# Patient Record
Sex: Male | Born: 1945 | Hispanic: Refuse to answer
Health system: Western US, Academic
[De-identification: ages and names within clinical notes are randomized; demographics above are authoritative.]

## PROBLEM LIST (undated history)

## (undated) DEATH — deceased

---

## 2014-01-14 ENCOUNTER — Ambulatory Visit: Payer: BLUE CROSS/BLUE SHIELD | Attending: Neurology | Admitting: Neurology

## 2014-01-14 ENCOUNTER — Encounter (HOSPITAL_BASED_OUTPATIENT_CLINIC_OR_DEPARTMENT_OTHER): Payer: Self-pay | Admitting: Neurology

## 2014-01-14 ENCOUNTER — Ambulatory Visit (HOSPITAL_BASED_OUTPATIENT_CLINIC_OR_DEPARTMENT_OTHER): Payer: BLUE CROSS/BLUE SHIELD | Admitting: Neurology

## 2014-01-14 DIAGNOSIS — G609 Hereditary and idiopathic neuropathy, unspecified: Secondary | ICD-10-CM | POA: Insufficient documentation

## 2014-01-14 DIAGNOSIS — G629 Polyneuropathy, unspecified: Secondary | ICD-10-CM

## 2014-01-14 LAB — 2H GLUCOSE TOL TEST, NONPREG
Glucose 2 Hr. Post Interp: <140
Glucose, BLD: 158 mg/dL

## 2014-01-14 LAB — ZINC: Zinc: 83 ug/dL (ref 60–120)

## 2014-01-14 LAB — GLUCOSE, FASTING: Glucose, Fasting: 95 mg/dL (ref 62–125)

## 2014-01-14 NOTE — Patient Instructions (Addendum)
IMPRESSION  68 year old man with glaucoma, BPH, dyslipidemia and impaired glucose intolerance with bilateral symmetric distal leg numbness with sensory level below the ankle for light touch and at the base of the toe for pinprick with reduction in vibration and mild proprioception changes on the left. Additionally there is some foot pain with walking and  reflex asymmetry in the arms. There is objective evidence of small fiber and large fiber sensory involvement, however, give the reflex changes cervical spine compression should be screened for if the patient develops arm symptoms.     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 30% of patients stabilize once glycemic control is optimized and 30% can achieve minor improvement.         PLAN:    Investigations  1. We will arrange for the patient to have nerve conduction [facility]  2. Will investigate the patient for reversible causes of neuropathy with SPEP, MMA, Copper, Zinc, VDRL, 2 hour glucose tolerance testing. Abnormal 2 hour glucose tolerance test.  3. The patient should have an MRI of his [facility] Spine [facility] by his primary care physician if he develops worsened arm symptoms.  4. For the patient's foot pain the primary care physicians should arrange for Xrays to ensure that there are no joint changes as a result of the neuropathy responsible for the patient's symptoms of pain with walking    Management  1. The primary care physician should monitor the patients impaired glucose tolerance. He may benefit from some diabetic diet education and exercise.  2. The patient should see physical therapy for balance training.  3. Alpha lipoic acid 300-600 mg per day has been shown to slow neuropathy in clinical trials this could be considered.   4. If the patient develops neuropathic pain gabapentin max daily does of 3600 mg can be  considered balancing the cognitive side effects.  .Marland Kitchen

## 2014-01-14 NOTE — Progress Notes (Signed)
[facility] CLINIC NOTE    SOURCE: The patient.    CHIEF COMPLAINT  Foot pain, loss of sensation and balance changes.      HISTORY OF PRESENTING ILLNESS  Geoffrey Merritt 68 year old man with glaucoma, BPH and dyslipidemia with billateraly numbness in the feet. The query is to evaluate for peripheral neuropathy.    His symptoms started 5 years ago approximately 2 months after achilles tendon repair with numbness in his left toes. Initially, he though this was related to his surgery, however, a few month later he developed similar sensory symptoms in his right foot.  His feet have distal tingling paresthesias. He is able to feel his feet but has altered sensation. The progression of his numbness has been slow, however, a few years after onset he noticed severe aching foot pain and hammering of his toes. His foot pain prevents him from running marathons and even doing short recreational running. The pain is centered in his big toe and into the ball of his foot he rates the pain intensity as 5/10. It is aggravated by walking more than 1/2 a block and walking barefoot on uneven surfaces, sand particularly. He has not tried nerve specific medication but has tried naprosen which was effective but discontinued because he felt he was taking it too often.     His balance has worsened particularly in the dark. He denies back or neck pain. His bowel and bladder function has been unchanged from baseline. He denies post prandial bloating or nausea. His bowel habits do not cycle between diarrhea and constipation. He denies inappropriate sweating or orthostatic lightheadedness. He does suffer from erectile dysfunction. His hearing is normal. He has recently had a mildly elevated HbAIC and glucose. There is a family history of diabetes in his sister who may also have neuropathy. There is no family history of hammer toes, high arches or inherited neuropathy.        PAST MEDICAL HISTORY  1. BPH  2. Dyslipidemia - muscle problems with initial  statin, lipitor cognitive issues  3. imapired glucose intolerance  4. Gluten free diet - improved bowel habits.  5. 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     ALLERGIES  Review of patient's allergies indicates:  No Known Allergies    MEDICATIONS     Previous Medications    ROSUVASTATIN CALCIUM (CRESTOR) 5 MG ORAL TAB    Take 5 mg by mouth daily.    TAMSULOSIN HCL (FLOMAX) 0.4 MG ORAL CAP    Take 0.4 mg by mouth daily.   3. Nicotine pills - for cognitive enhancement 20 mg nictoine /day x 5 years    SOCIAL HISTORY  Not a current smoker. He drinks 5 drinks per week. He denies the use of recreational drugs.  He is a Scientist, water qualityneuroscientist (department chair). He is married with 3 children.  FAMILY HISTORY  1.  Mother - [facility]  2. Father healthy   3. Sister - diabetic ? Possible neuropathy (dx 5 years ago - 2 years older)  4. Children healthy    REVIEW OF SYSTEMS    All other systems negative as per history of present illness and the scanned patient intake questionnaire, including the following.    NEUROLOGIC: loss of balance, loss of feeling.  PSYCHIATRIC: No anxiety, depression, crying spells, excessive worry, mild memory trouble, mild trouble concentrating.  EYES: No blurry vision.  ENMT: No dizziness and teeth trouble, persistent hoarseness, voice change, swallowing difficulties.  HEART: No  chest pain. No leg pain when walking and ankle swelling.  LUNGS: Shortness of breath.  GENITOURINARY: Trouble holding urine, urinary frequency. Sexual problems with erectile difficulties.  BONES/JOINTS: No joint pain and swelling, chronic low back and neck pain.  CONSTITUTIONAL: No unexplained weight loss or gain.  ENDOCRINE: No intolerance of cold or heat.  ALLERGIES: Seasonal allergies.  LYMPHATIC/HEMATOLOGIC: No easy bruising or bleeding.  STOMACH: No frequent stomach pain, constipation.      PHYSICAL EXAMINATION  BP 142/84 mmHg  Pulse 69  Ht 6\' 3"  (1.905 m)  Wt 211 lb 3.2 oz (95.8 kg)  BMI  26.40 kg/m2  GENERAL: Well-appearing in no acute distress.  HEENT: Normocephalic, atraumatic. Sclerae anicteric.  CARDIOVASCULAR: Regular rate and rhythm.  LUNGS: Clear to auscultation bilaterally.  ABDOMEN: Soft, nontender.  EXTREMITIES: No edema.  NEUROLOGIC: Mental status and language are intact.     On cranial nerve examination, pupils are asymmetric R 2 L3, round, and reactive to light. Conjunctival injection bilaterally (Gluacoma).  Extraocular muscles are intact bilaterally. Normal facial sensation and symmetry. Hearing was grossly intact to finger snap bilaterally. Uvula and trapezius elevated symmetrically. The tongue was normal strength and bulk without fasciculations.     On motor examination. Normal bulk and tone. MUSCLE POWER ASSESSMENT USING MRC:    Right  Left  Right Left    Deltoid  5 5 Hip Flexion 5 5-   Biceps  5 5 Hip Extension 5 5   Triceps  5 5 Hip Adduction 5 5   Wrist extension  5 5 Hip Abduction 5 5-   FDI  5 5 Knee Extension 5 5   ADM 5 5 Knee flexion 5 5   APB 5 5 Foot Dorsiflexion 5 5   Finger flexion 5 5 Inversion 5 5       Eversion 5 5      EHL 5 5      Plantar Flexion 5 5      Toe Extension 5 5             MUSCLE POWER ASSESSMENT USING A HANDHELD DYNAMOMETER (IN POUNDS):    Right  Left    Deltoid  43 42    Biceps  51 48    Triceps  43 41   Wrist extension  40 40   FDI  11 11    APB 13 12   Handgrip  93 85    Hip flexion  51 51   Knee extension  51 51    Knee flexion  51  51    Foot Dorsiflexion   35 31     REFLEXES:     Right  Left  Right Left    Biceps 1+ 2+ Knee 2+ 1+   Brachioradialis  1+ 2+ Ankle 1+ 1+   Triceps  1+ 2+ Plantar flexor flexor                       On sensory examination for light touch, pinprick and temperature were normal in both upper extremities. Light touch sensory change at the ankle, pinprick absent at the toes and present at the base of the toes. Vibration thresholds were absent at the toes normal at the knees.  Joint position sense absent at the left toe  normal at the ankle. Romberg sign was negative.    On cerebellar examination, finger-nose-finger, heel-to-shin maneuver were abnormal with past pointing and intension tremor.  Rapid alternating movements were intact  bilaterally.   Tremor in left greater than right leg.      On gait examination, gait was normal based. He was able to raise on his heals and toes.  Tandem gait was normal.     Cardiovascular examination showed normal S1 and S2 without mumurs or extra heart sounds. Respiratory examination showed good air entry to bases bilaterally.      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    IMPRESSION  68 year old man with glaucoma, BPH, dyslipidemia and impaired glucose intolerance with bilateral symmetric distal leg numbness with sensory level below the ankle for light touch and at the base of the toe for pinprick with reduction in vibration and mild proprioception changes on the left. Additionally there is some reflex asymmetry in the arms. There is objective evidence of small fiber and large fiber sensory involvement, however, give the reflex changes cervical spine compression should be screened for if the patient develops arm symptoms.     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 30% of patients stabilize once glycemic control is optimized and 30% can achieve minor improvement.         PLAN:    Investigations  1. We will arrange for the patient to have nerve conduction and  EMG testing at the [facility]  2. Will investigate the patient for reversible causes of neuropathy with SPEP, MMA, Copper, Zinc, VDRL, 2 hour glucose tolerance testing. Abnormal 2 hour glucose tolerance test.  3. The patient should have an MRI of his C Spine arranged in [facility] by his primary care physician if he develops worsened arm symptoms.  4. For the patient's foot pain the primary care physicians should arrange for Xrays to ensure that there are no joint changes as a result of the neuropathy responsible for the patient's symptoms of pain with walking.     Management  1. The primary care physician should monitor the patients impaired glucose tolerance. He may benefit from some diabetic diet education and exercise.  2. The patient should see physical therapy for balance training.  3. Alpha lipoic acid 300-600 mg per day has been shown to slow neuropathy in clinical trials this could be considered.   4. If the patient develops neuropathic pain gabapentin max daily does of 3600 mg can be considered balancing the cognitive side effects.

## 2014-01-15 LAB — SEROLOGIC SYPHILIS PANEL, SRM
Syphilis Igg Ab Result: NEGATIVE
Syphilis Screening Status: NEGATIVE

## 2014-01-15 LAB — PROTEIN ELECTR. REFLX PNL
Albumin: 4.7 g/dL (ref 3.5–4.9)
Alpha 1: 0.1 g/dL (ref 0.1–0.3)
Alpha 2: 0.6 g/dL (ref 0.3–0.8)
Beta: 0.7 g/dL (ref 0.6–1.0)
Electrophoresis Interp:: NORMAL
Gamma: 0.6 g/dL (ref 0.4–1.4)
Protein (Total): 6.7 g/dL (ref 6.0–8.2)

## 2014-01-15 LAB — ANA REFLEX COMPREHENSIVE PANEL: Antibodies to Nuclear Ags by IF (ANA): NEGATIVE

## 2014-01-15 NOTE — Progress Notes (Signed)
ATTENDING ATTESTATION  I saw Geoffrey Merritt with Dr. Trixie Merritt as the supervising physician on 01/14/2014.  I saw and evaluated the patient and agree with Dr.Madhani's note.  I obtained a history and performed a directed physical examination.  I have reviewed and discussed this patient's history, physical examination findings, outpatient workup, working diagnosis, care and disposition plans with Dr. Trixie Merritt. Where necessary for either clarification or accuracy, I have added to the above note. I agree with the findings and plan as documented in the note.      We discussed that peripheral neuropathies at [facility] are idiopathic in 40-60% of our patients, and therefore most likely related to age, genetics, and environmental factors.  We discussed that most of us are losing neurons as we age.  In fact, 2.2% of the population has a sensory-predominant peripheral neuropathy, with the rate increasing to 8% above the age of 68.  We discussed that the genetic component is most likely multigenetic and multifactorial, and determines the slope of the loss of sensory neurons.  Environmental factor such as probably being hospitalized or stress during hospitalizations or surgical procedures can reduce the number of nerves in quantal losses.  Dr. Letitia Merritt noticed a change after his Achilles tendon surgery.  The decrease in number of nerves reaches a threshold for which patients experience pain and numbness and tingling.  That threshold is different for different patients.  Furthermore, today he has an elevated glucose tolerance test.  We discussed that there are studies that show that improving glucose tolerance with exercise will be helpful.  Diet control is also important.    We discussed that our job is to rule out systemic diseases.  Other tests include EMG/nerve conduction studies, which we did today.    We discussed that medications can only decrease pain by 3/10 points.  Maximizing medication  beyond that level will only cause more side-effects.  Other non-medication modalities may be helpful such as meditation, mindfullness technique, exercise, hypnotherapy, acupuncture.  All of these can cause reduction in pain by 6/10 points.  We discussed that his pain is mainly in his right toe and that may be from remodeling of the bone (beginning of Charcot joint) that you see with sensory neuropathies.  We discussed that there are no good medications for numbness and tingling.  Starting on a low-dose of gabapentin may help neuropathic pain, but most likely will have cognitive side-effects.    The plan is to see the patient back as needed in ~1 year.    Marlana SalvageLeo H. Regino SchultzeWang, MD PhD  Attending Physician  Department of Neurology  [facility]

## 2014-01-16 LAB — METHYLMALONIC ACID: Methylmalonic Acid: 0.14 mcmol/L (ref <0.30)

## 2014-01-16 LAB — COPPER: Copper: 63 ug/dL — ABNORMAL LOW (ref 70–140)

## 2014-01-29 ENCOUNTER — Encounter (HOSPITAL_BASED_OUTPATIENT_CLINIC_OR_DEPARTMENT_OTHER): Payer: Self-pay | Admitting: Neurology

## 2014-07-24 ENCOUNTER — Encounter (HOSPITAL_BASED_OUTPATIENT_CLINIC_OR_DEPARTMENT_OTHER): Payer: Self-pay | Admitting: Neurology

## 2014-07-31 ENCOUNTER — Other Ambulatory Visit: Payer: Self-pay

## 2020-09-13 IMAGING — CT CT ABD/PEL WO/W CONT HEMATURIA PROTOCOL
2 of 7 series · 13 of 46 positions shown, 15 images · non-contrast
Comparison: None.

HISTORY: Gross hematuria.
TECHNIQUE: Axial images of the abdomen and pelvis are obtained from the hemidiaphragms to the pubic symphysis prior to and following 125 mL Ysovue-CNF intravenous contrast.  Serum creatinine 0.9 mg/dL. 1 L. of water given orally. Nephrographic phase and delayed phase images are obtained. Coronal, sagittal and 3-D hematuria protocol reformation image images are obtained.

[Series 2: pre contrast · axial · non-contrast · 0.82mm/px · z∈[-491,-74]mm · 10 of 171 slices shown, 12 images]
[im 16/171  soft-tissue]
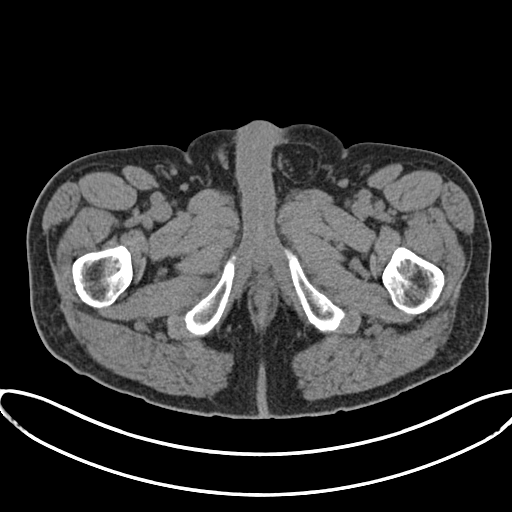
[im 16/171  bone]
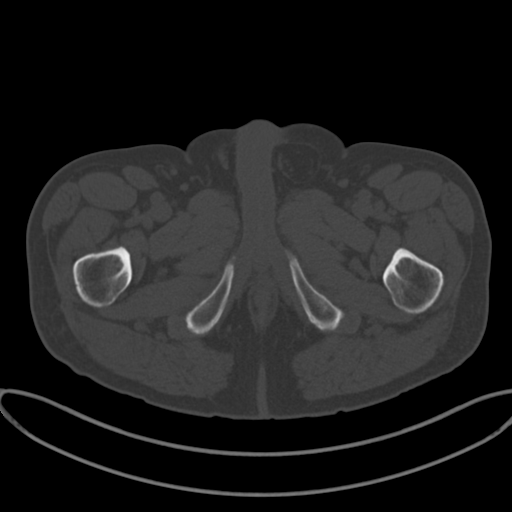
[im 31/171  soft-tissue]
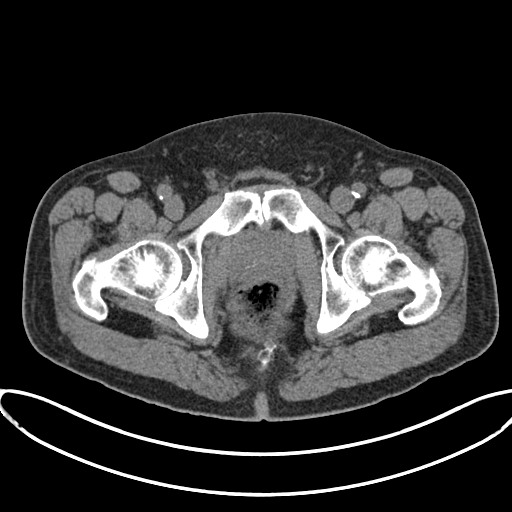
[im 47/171  soft-tissue]
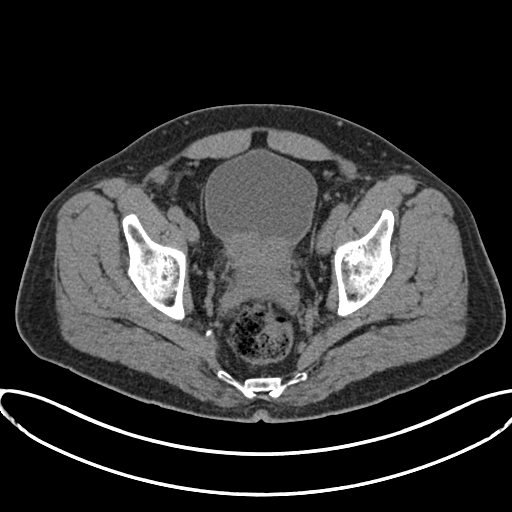
[im 62/171  soft-tissue]
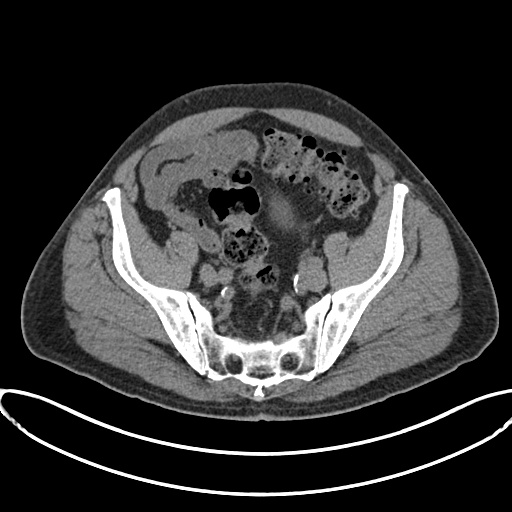
[im 78/171  soft-tissue]
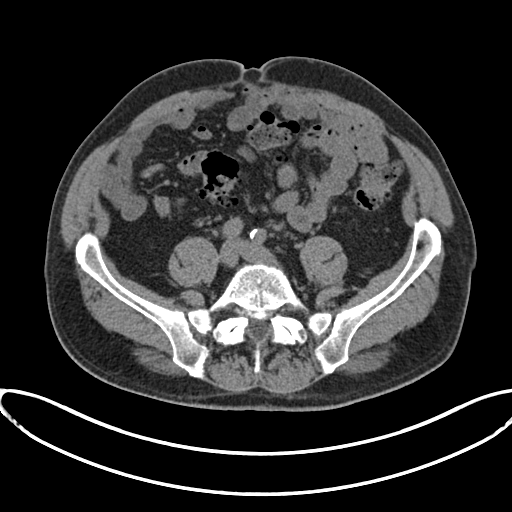
[im 93/171  soft-tissue]
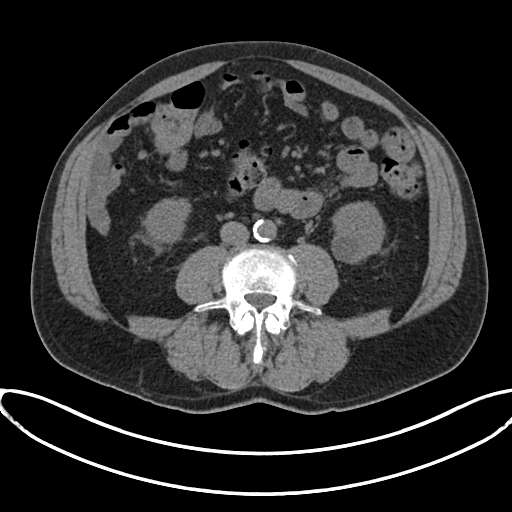
[im 109/171  soft-tissue]
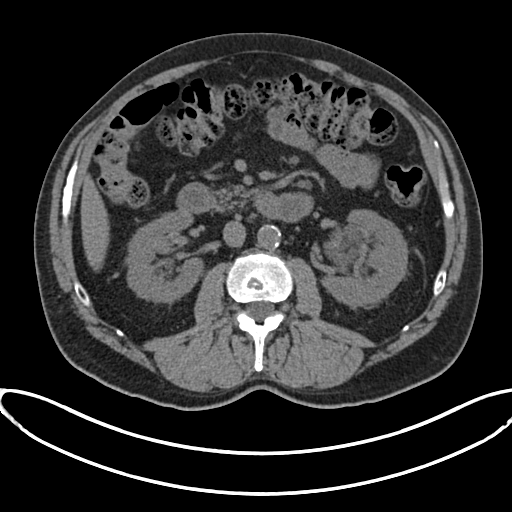
[im 124/171  soft-tissue]
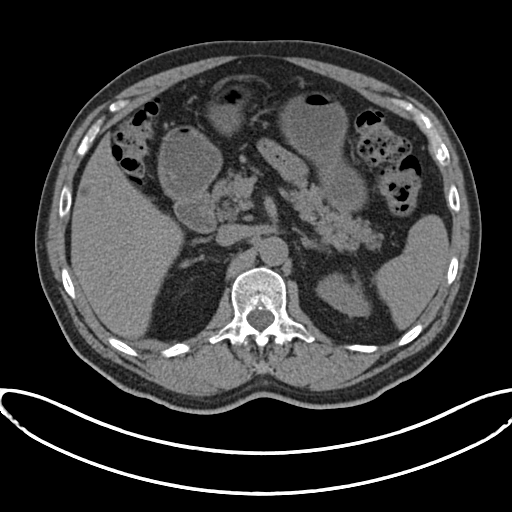
[im 140/171  soft-tissue]
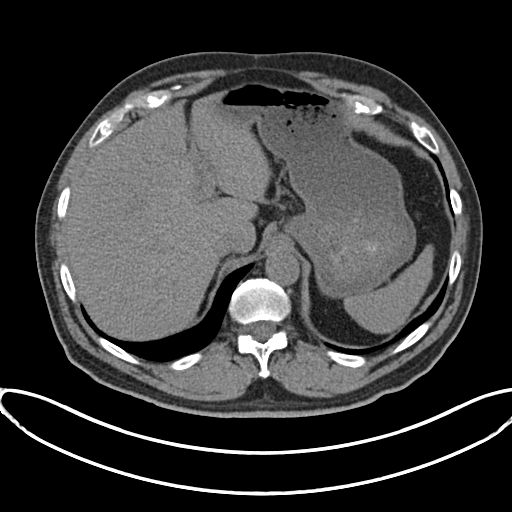
[im 140/171  bone]
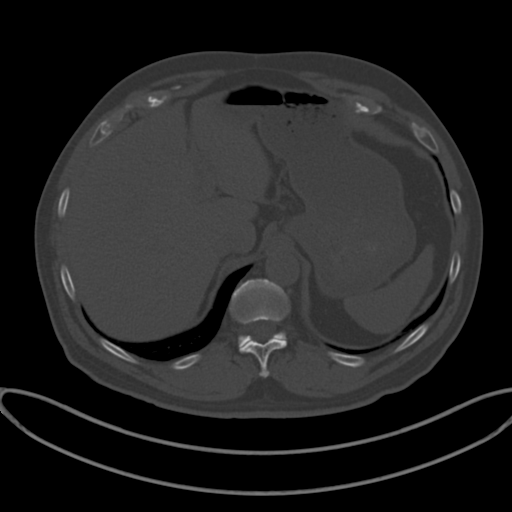
[im 155/171  soft-tissue]
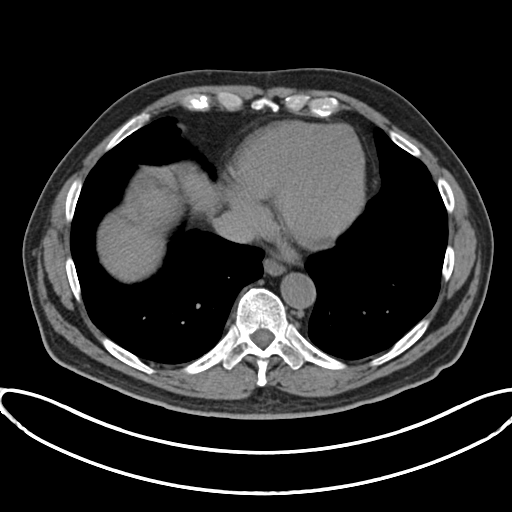

[Series 7: coronal · coronal · 0.88mm/px · 3 of 105 slices shown]
[im 35/105  soft-tissue]
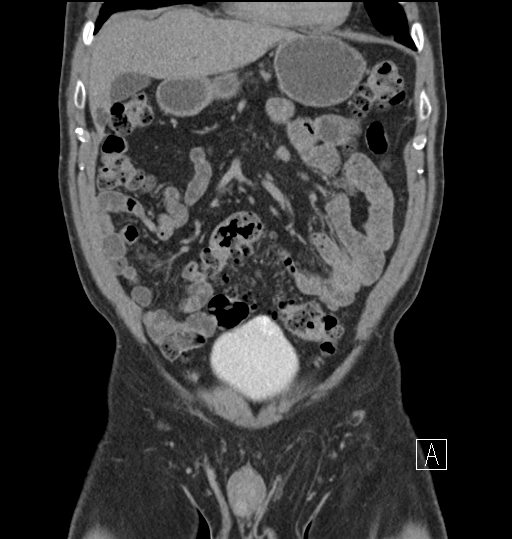
[im 47/105  soft-tissue]
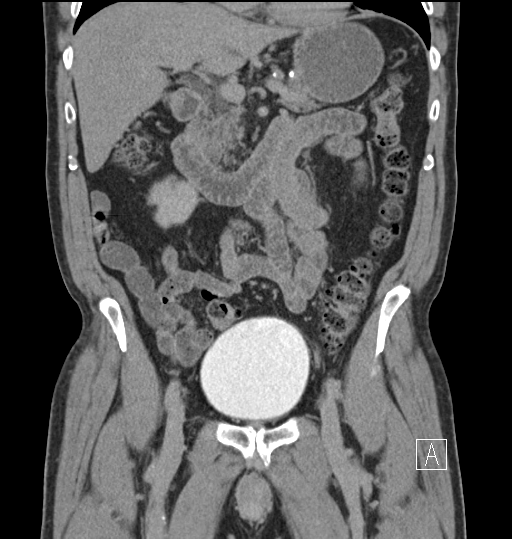
[im 58/105  soft-tissue]
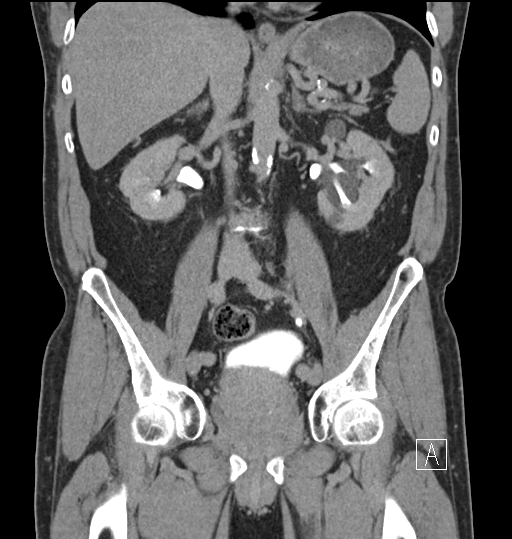

[13 of 46 positions shown; findings below may reference images not displayed]

FINDINGS: CT ABDOMEN: Dependent changes of lung bases identified. Subcentimeter thin-walled cyst of left lung base is seen.

Heart size is normal. There is no pericardial effusion. Calcified plaques of coronary arteries identified. Calcified plaques of aorta and its branches are seen without aneurysm. Inferior vena cava is normal.

There is no nephrolithiasis, ureteric stone, hydronephrosis or hydroureter. Multiple left-sided parapelvic simple renal cysts identified. Cortical-based simple renal cysts are also identified bilaterally. No definitive renal mass lesion is seen. The rest of the renal parenchyma shows normal enhancement with otherwise normal opacification of the rest of the renal pelves, calyces and ureters.

Several simple hepatic cysts are identified. Gallbladder, spleen and adrenal glands are normal.

Areas of focal hypoattenuation involving pancreatic head are seen on image [DATE]. No other pancreatic mass or cyst is identified. There is no pancreatic duct dilation.

Distal esophagus, stomach and small bowel loops are normal. Appendix is normal. Diverticulosis of colon identified, most prominent involving descending colon without adjacent inflammatory change seen.

No enlarged retroperitoneal lymph nodes seen.

1.5 x 1 cm umbilical fatty hernia is seen.

Degenerative changes of spine are seen. Grade 1 anterior spondylolisthesis of L4 relative to L3 and L5 secondary to degenerative facet disease identified.

CT PELVIS: Prostate gland is enlarged up to 8.3 cm transverse with questionable exophytic nodular component on the left seen on image [DATE]. Seminal vesicles are normal.

Focal dilation of distal left ureter up to 9 mm in diameter is seen on image [DATE]. Distal right ureter is normal.

Nodular component of prostate gland indenting floor of urinary bladder is seen on sagittal images. Urinary bladder shows no stone or mass lesion. Urinary bladder otherwise shows normal opacification.

Diverticulosis of sigmoid colon identified without adjacent inflammatory change.

No enlarged pelvic wall or inguinal lymph node is seen.

Calcified plaques of iliac arteries are seen.

Degenerative changes of SI joints with vacuum phenomena are seen. Left inguinal lipoma or fatty hernia up to 2 cm in diameter is seen.

Delayed-phase images, coronal and sagittal, as well as 3-D hematuria protocol reformation images confirm above findings.
IMPRESSION: Significant prostatomegaly with questionable nodular exophytic component on the left, as noted. Clinical correlation is advised.

Focal dilation of distal left ureter, likely related to prostatomegaly is seen.

Multiple left-sided parapelvic simple renal cysts and bilateral cortical-based simple renal cysts are identified. No other urinary tract pathology is seen.

Areas of hypoattenuation at pancreatic head region identified, due to areas of fatty infiltration versus small cysts. Followup MRI abdomen exam may performed.

Additional chronic findings as detailed above.

Total radiation dose to patient is CTDIvol 41.32 mGy and DLP 1790.86 mGy-cm.

## 2020-10-13 IMAGING — MR MRI PROSTATE WO/W CONTRAST
9 series · 48 of 48 positions shown · IV contrast (20CC PROHANCE)
Comparison: CT hematuria protocol for 09/13/2020

HISTORY: Elevated prostate-specific antigen with family history of prostate cancer
TECHNIQUE: Multi-parametric MRI of the prostate gland without and with contrast was performed on a 3 Tesla magnet.

Contrast dose: 20 cc ProHance

[Series 2: haste_3 plane loc · axial · 6.0mm · 1.17mm/px · 1 of 15 slices shown]
[im 1/15]
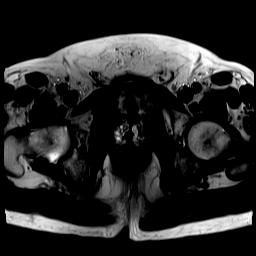

[Series 3: t1_axial_tse · axial · 6.0mm · 0.99mm/px · 1 of 28 slices shown]
[im 1/28]
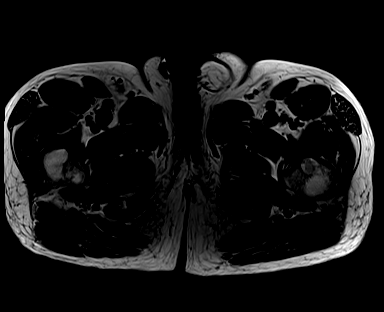

[Series 4: t2_(person_name)_(person_name) · axial · 3.0mm · 0.56mm/px · 1 of 36 slices shown]
[im 1/36]
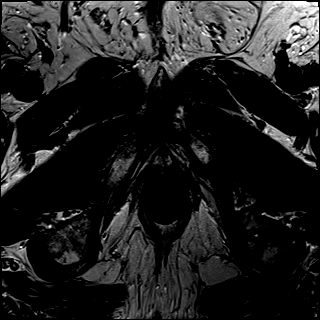

[Series 5: t2_tse_cor · coronal · 3.0mm · 0.62mm/px · 1 of 21 slices shown]
[im 1/21]
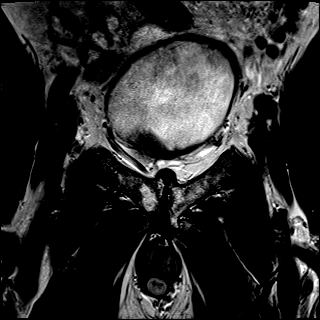

[Series 6: t2_tse_blade_sag · sagittal · 3.0mm · 0.66mm/px · 1 of 28 slices shown]
[im 1/28]
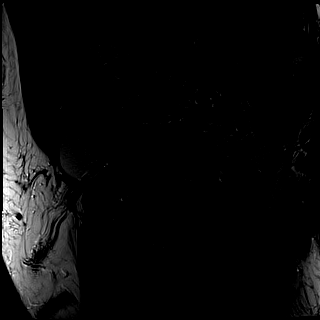

[Series 7: resolve_diff_b50_800_(person_name)_(person_name)2_tracew · axial · 3.5mm · 1.64mm/px · 1 of 29 slices shown]
[im 1/29]
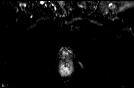

[Series 8: resolve_diff_b50_800_(person_name)_(person_name)2_adc · axial · 3.5mm · 1.64mm/px · 1 of 29 slices shown]
[im 1/29]
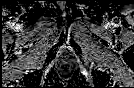

[Series 9: resolve_diff_b50_800_(person_name)_(person_name)2_calc_bval · axial · 3.5mm · 1.64mm/px · 1 of 29 slices shown]
[im 1/29]
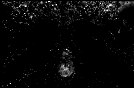

[Series 10: t1_twist_(person_name)_dyn · axial · 3.5mm · 1.20mm/px · z∈[-27,+68]mm · 40 of 1344 slices shown]
[im 1/1344]
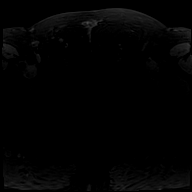
[im 35/1344]
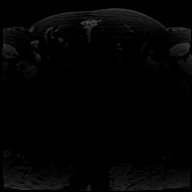
[im 69/1344]
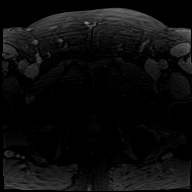
[im 104/1344]
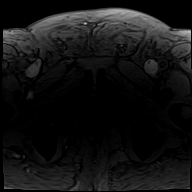
[im 138/1344]
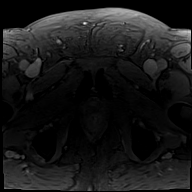
[im 173/1344]
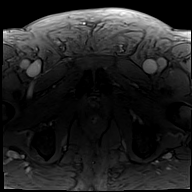
[im 207/1344]
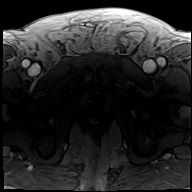
[im 242/1344]
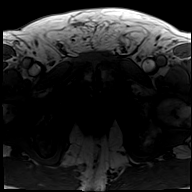
[im 276/1344]
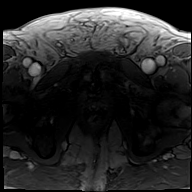
[im 310/1344]
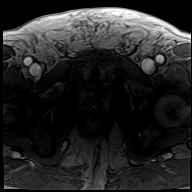
[im 345/1344]
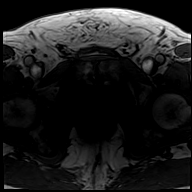
[im 379/1344]
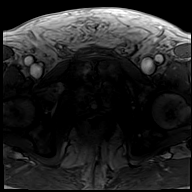
[im 414/1344]
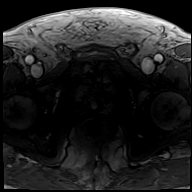
[im 448/1344]
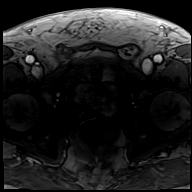
[im 483/1344]
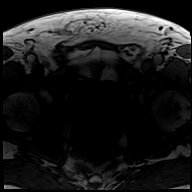
[im 517/1344]
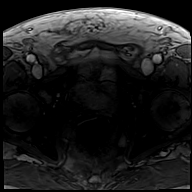
[im 551/1344]
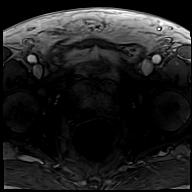
[im 586/1344]
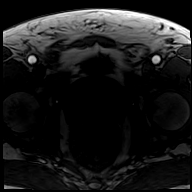
[im 620/1344]
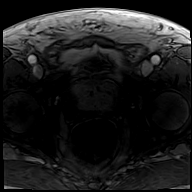
[im 655/1344]
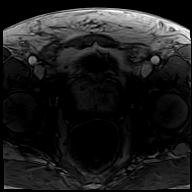
[im 689/1344]
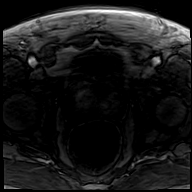
[im 724/1344]
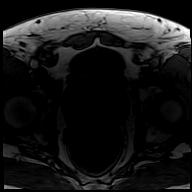
[im 758/1344]
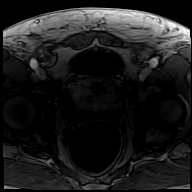
[im 793/1344]
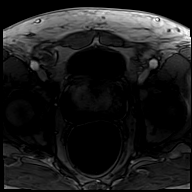
[im 827/1344]
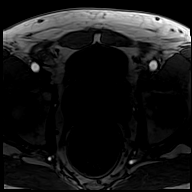
[im 861/1344]
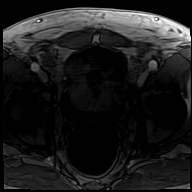
[im 896/1344]
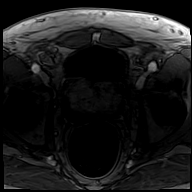
[im 930/1344]
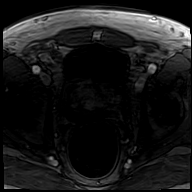
[im 965/1344]
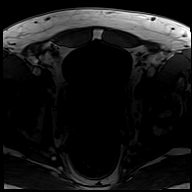
[im 999/1344]
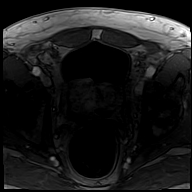
[im 1034/1344]
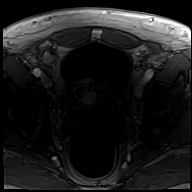
[im 1068/1344]
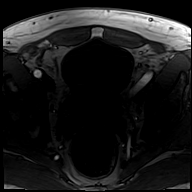
[im 1102/1344]
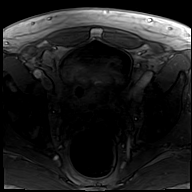
[im 1137/1344]
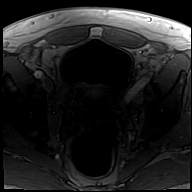
[im 1171/1344]
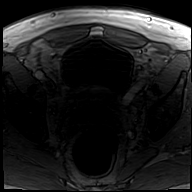
[im 1206/1344]
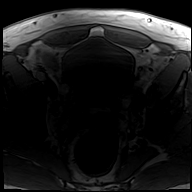
[im 1240/1344]
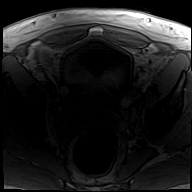
[im 1275/1344]
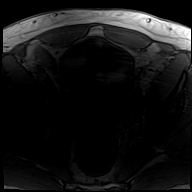
[im 1309/1344]
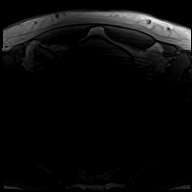
[im 1344/1344]
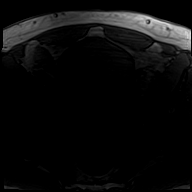

[48 of 48 positions shown; findings below may reference images not displayed]

FINDINGS: The prostate gland is enlarged indenting the base of the urinary bladder. It measures approximately 8.1 cm in width by 4.9 cm anterior posterior by 6.7 cm in length. Estimated prostate volume is 138.3 mL.

PSA level is 13.5 ng/mL.

PSA density is 0.10.

There are some small scattered areas of increased T1 signal in the right transitional zone.

Transitional zone: The transitional zone is enlarged just protruding into the base of the urinary bladder. It contains multiple, mostly circumscribed heterogeneous nodules. No nodule discretely stands out from one another. Findings most consistent with benign prostatic hyperplasia. There is a central defect suggesting previous TURP.

Peripheral zone: The peripheral zone is mostly increased signal on T2 with some scattered mostly linear areas of decreased signal without associated restricted diffusion or asymmetric contrast enhancement. Findings most consistent with chronic postinflammatory type change.

The neurovascular bundles appear normal. Seminal vesicles are unremarkable.

No adenopathy or marrow-replacing lesions are seen. No free fluid is seen. There is a moderate-sized fat-containing left inguinal hernia defect. There is diverticulosis coli involving the sigmoid colon. There is a small fat-containing umbilical hernia. Incidentally noted is a Tarlov cyst in the sacrum.
IMPRESSION: 1.
Marked prostatomegaly with findings in the transitional zone most consistent with benign prostatic hyperplasia and in the peripheral zone chronic postinflammatory type change.

2.
No MRI findings suspicious for clinically significant prostate cancer.

3.
Please see above for additional comments and findings.

PI-RADS 2: Low (clinically significant prostate cancer unlikely)

## 2020-10-14 IMAGING — MR MRCP (MR Cholangiogram w/Reconstructions)
9 of 10 series · 46 of 48 positions shown · IV contrast (prohance)
Comparison: CT abdomen and pelvis exam 09/13/2020.

HISTORY: Cyst of pancreas.
TECHNIQUE: Coronal and axial noncontrast and contrast MRI abdomen study performed without and with 20 mL of ProHance. MRCP images are obtained. 3-D MRCP images are obtained. Post-processing software generated rotating 3D MIP images, under concurrent physician supervision.

[Series 2: t2_haste_cor · coronal · 6.0mm · 0.78mm/px · 2 of 30 slices shown]
[im 1/30]
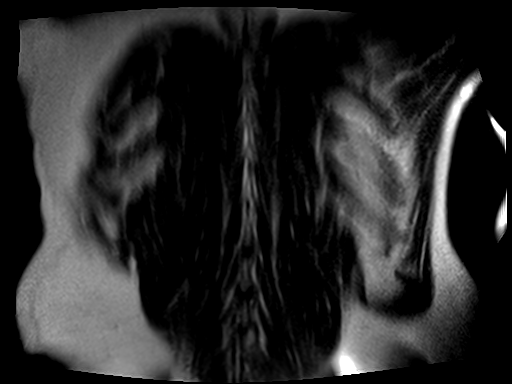
[im 30/30]
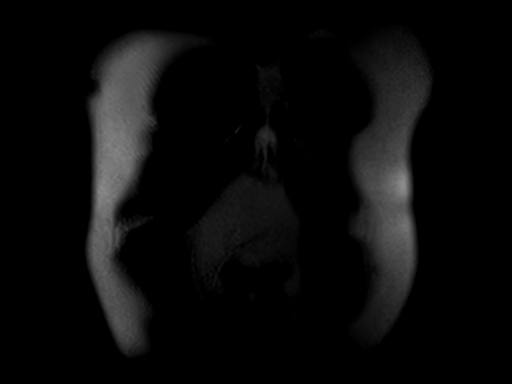

[Series 3: t2_(person_name)_(person_name)_(person_name) · axial · 6.0mm · 0.82mm/px · z∈[-115,+102]mm · 2 of 30 slices shown]
[im 1/30]
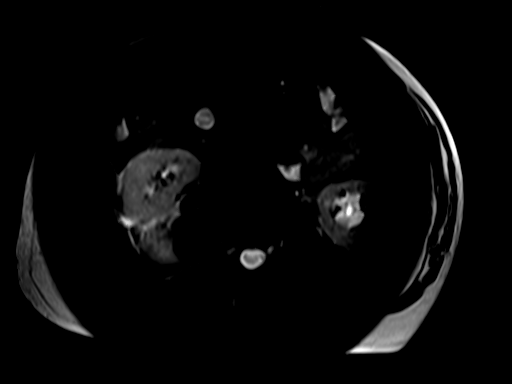
[im 30/30]
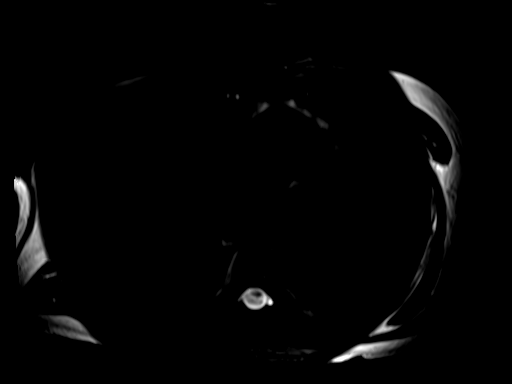

[Series 8: t1_vibe_(person_name)_(person_name)_in · axial · 3.0mm · 1.25mm/px · z∈[-109,+104]mm · 6 of 72 slices shown]
[im 1/72]
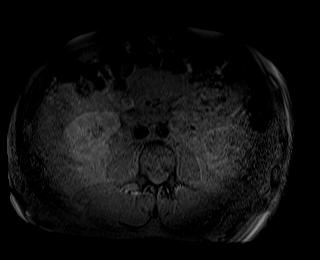
[im 15/72]
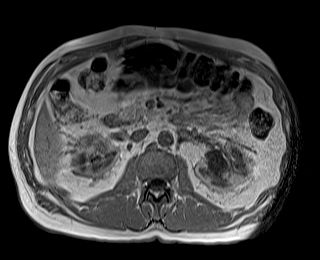
[im 29/72]
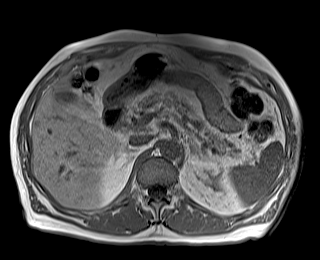
[im 43/72]
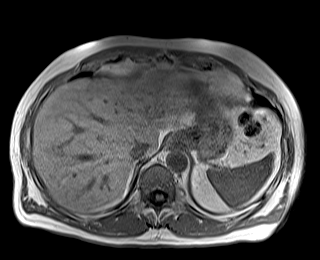
[im 57/72]
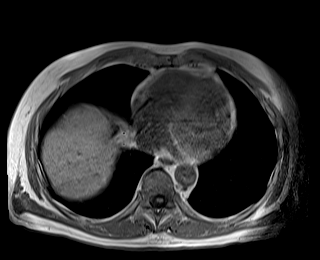
[im 72/72]
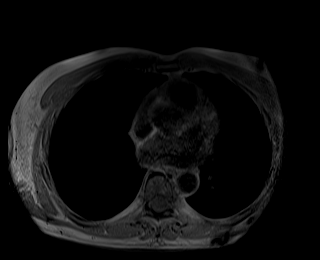

[Series 9: t1_vibe_(person_name)_(person_name)_(person_name) · axial · 3.0mm · 1.25mm/px · z∈[-109,+104]mm · 6 of 72 slices shown (1 of 2)]
[im 1/72]
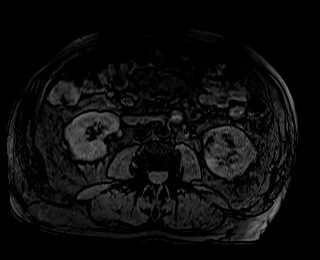
[im 15/72]
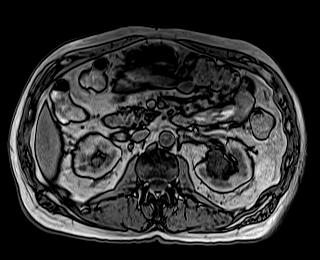
[im 29/72]
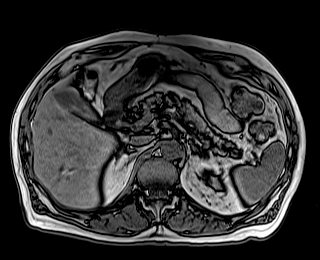
[im 43/72]
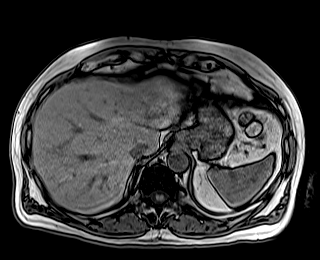
[im 57/72]
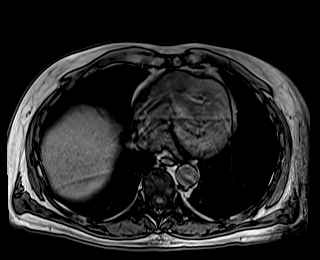
[im 72/72]
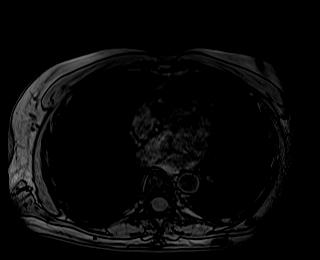

[Series 11: t1_vibe_(person_name)_(person_name)_(person_name) · axial · 3.0mm · 1.25mm/px · z∈[-109,+104]mm · 6 of 72 slices shown (2 of 2)]
[im 1/72]
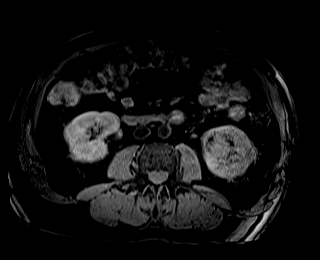
[im 15/72]
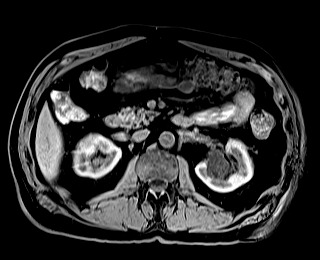
[im 29/72]
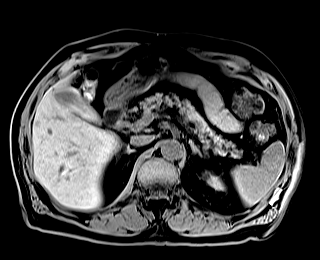
[im 43/72]
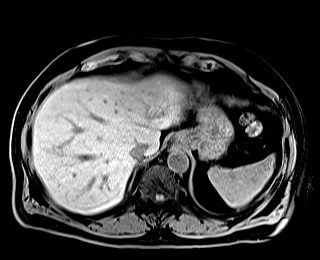
[im 57/72]
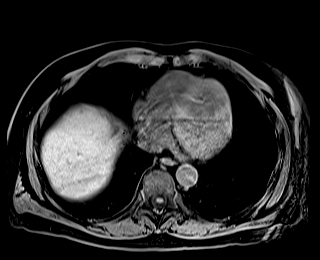
[im 72/72]
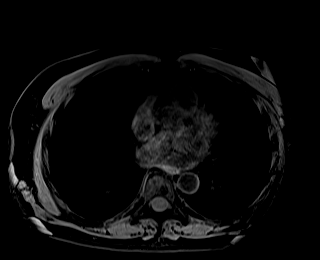

[Series 15: t1_vibe_(person_name)_(person_name)+(person_name)1_w · axial · 3.0mm · 1.25mm/px · z∈[-109,+104]mm · 6 of 72 slices shown]
[im 1/72]
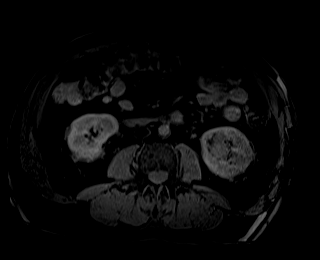
[im 15/72]
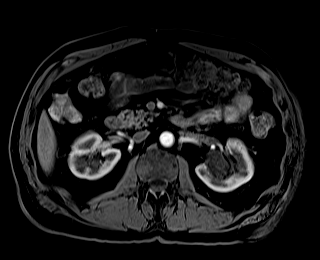
[im 29/72]
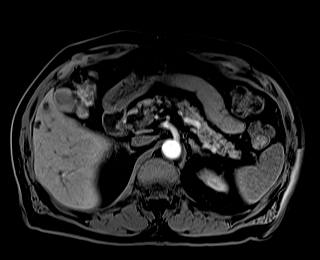
[im 43/72]
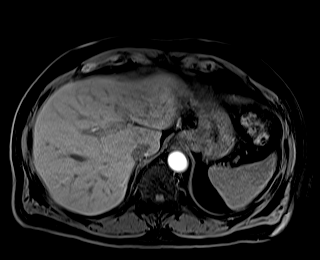
[im 57/72]
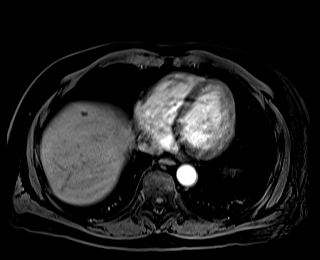
[im 72/72]
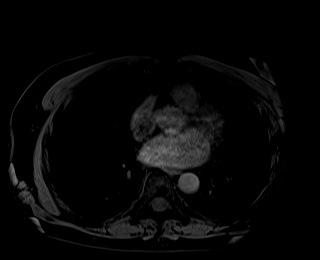

[Series 19: t1_vibe_(person_name)_(person_name)+(person_name)2_w · axial · 3.0mm · 1.25mm/px · z∈[-109,+104]mm · 6 of 72 slices shown]
[im 1/72]
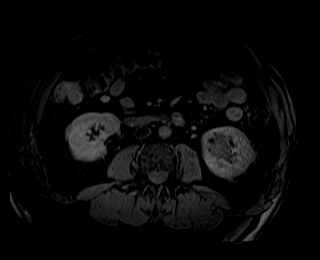
[im 15/72]
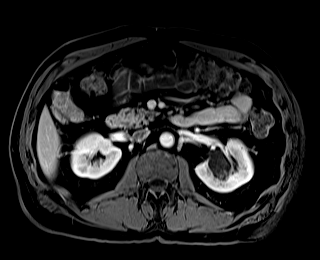
[im 29/72]
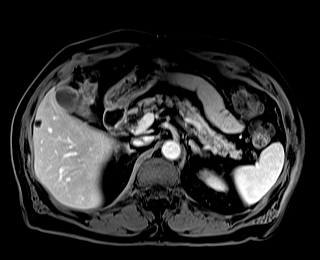
[im 43/72]
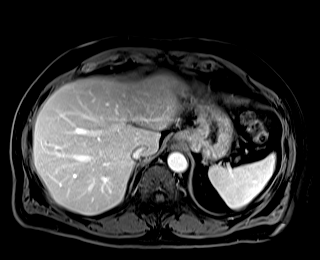
[im 57/72]
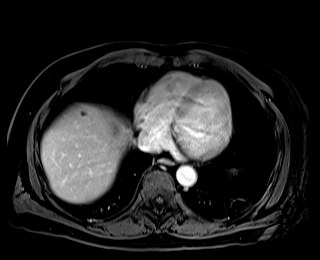
[im 72/72]
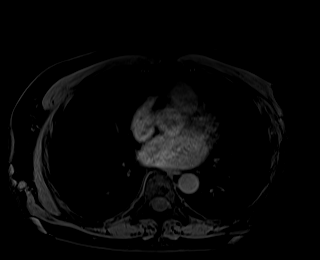

[Series 24: t1_vibe_(person_name)_(person_name)+(person_name)3_w · axial · 3.0mm · 1.25mm/px · z∈[-109,+104]mm · 6 of 72 slices shown]
[im 1/72]
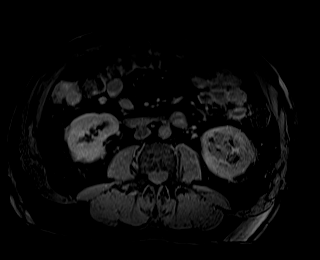
[im 15/72]
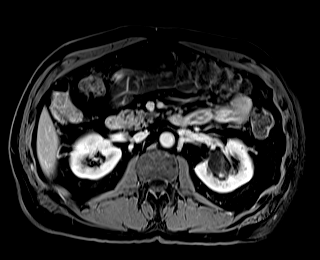
[im 29/72]
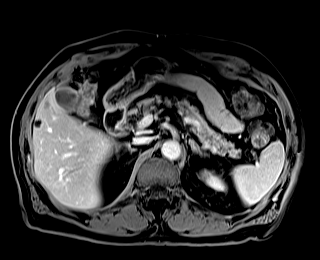
[im 43/72]
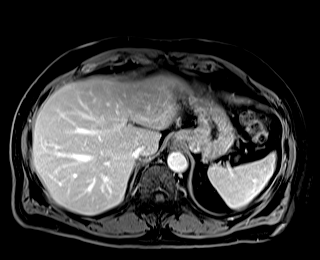
[im 57/72]
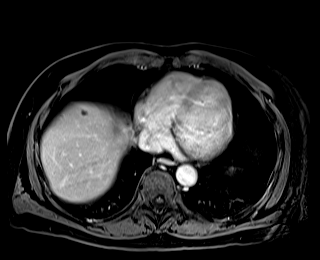
[im 72/72]
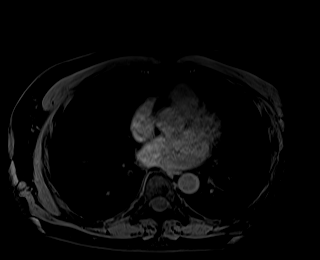

[Series 29: t1_vibe_(person_name)_(person_name)+(person_name)4_w · axial · 3.0mm · 1.25mm/px · z∈[-109,+104]mm · 6 of 72 slices shown]
[im 1/72]
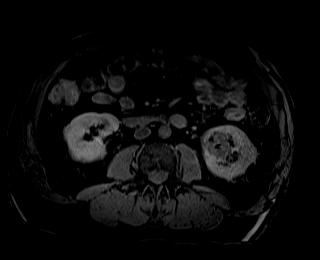
[im 15/72]
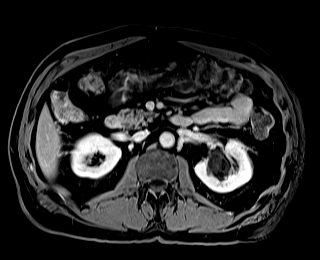
[im 29/72]
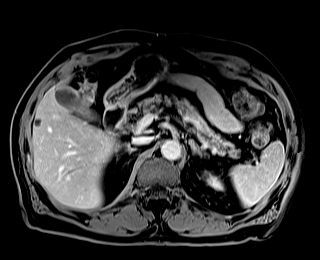
[im 43/72]
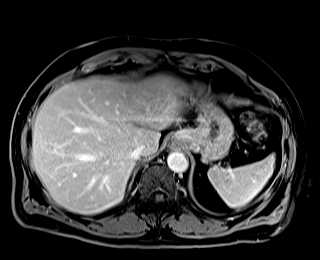
[im 57/72]
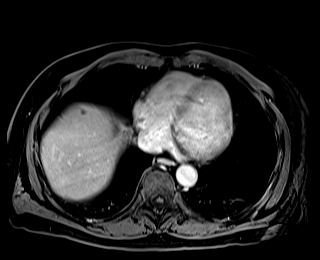
[im 72/72]
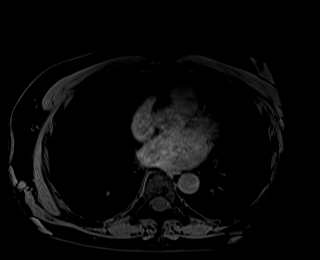

[46 of 48 positions shown; findings below may reference images not displayed]

FINDINGS: Dependent changes of lung bases identified. No mass lesion is seen.

Heart size is normal. There is no pericardial effusion. Aorta and inferior vena cava are normal.

Multiple simple hepatic cysts are seen. No liver mass or intrahepatic biliary ductal dilation is seen. Liver contour is smooth. Hepatic veins, portal veins, splenic vein and superior mesenteric vein are patent.

Gallbladder is normal. Common bile duct is normal. Pancreatic duct is normal caliber. No choledocholithiasis identified.

Several small cystic areas up to 1.1 cm in diameter involving pancreatic head with questionable communication to the pancreatic duct branch identified, best seen on image 17, 18 and 19 of series 2. Questionable additional tiny cystic areas involving pancreatic tail are seen. No definitive enhancing mass lesion of pancreas identified. No area of atrophy of pancreas identified.

Spleen and adrenal glands are normal.

Bilateral parapelvic renal cysts identified, more prominent on the left. Small cortical simple cysts of kidneys also seen. No obvious renal mass lesion is noted.

Distal esophagus, stomach and small bowel loops are normal.

Degenerative changes of spine are seen.

There is no ascites.

MRCP images confirm above findings.
IMPRESSION: Small cystic areas involving pancreatic head and to a lesser degree pancreatic tail noted, possibly due to side branch IPMN. Followup MRI abdomen with MRCP study after one-year interval is advised.

Additional chronic findings as noted.

No other interval change.

## 2021-05-18 IMAGING — US LIVER ELASTOGRAPHY
1 series · 10 of 10 positions shown · non-contrast
Comparison: Previous CT of the abdomen September 13, 2020

HISTORY: 75 year-old male with elevated liver enzymes .

[Series 1: liver elastography · 10 of 10 slices shown]
[im 1/10]
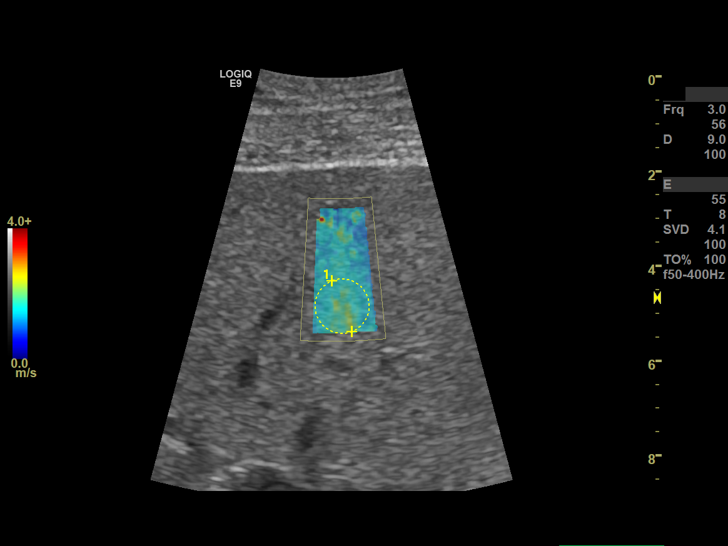
[im 2/10]
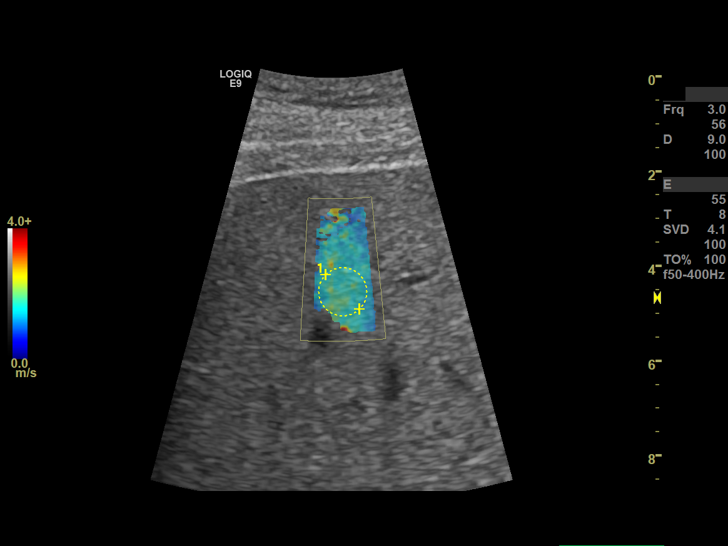
[im 3/10]
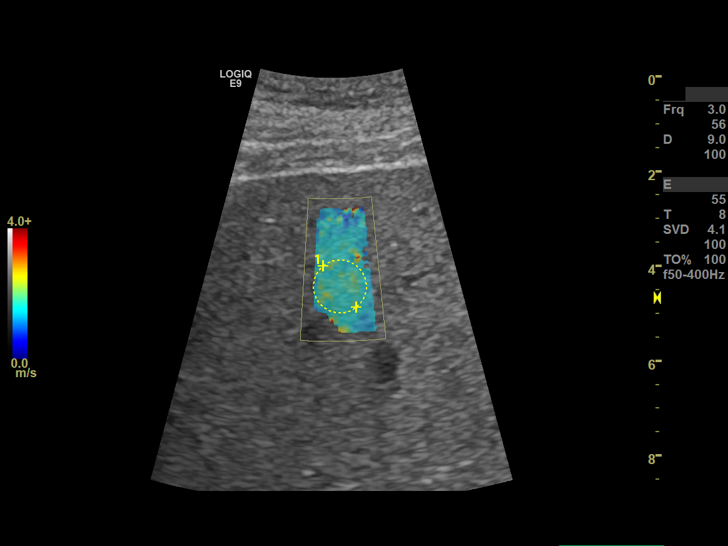
[im 4/10]
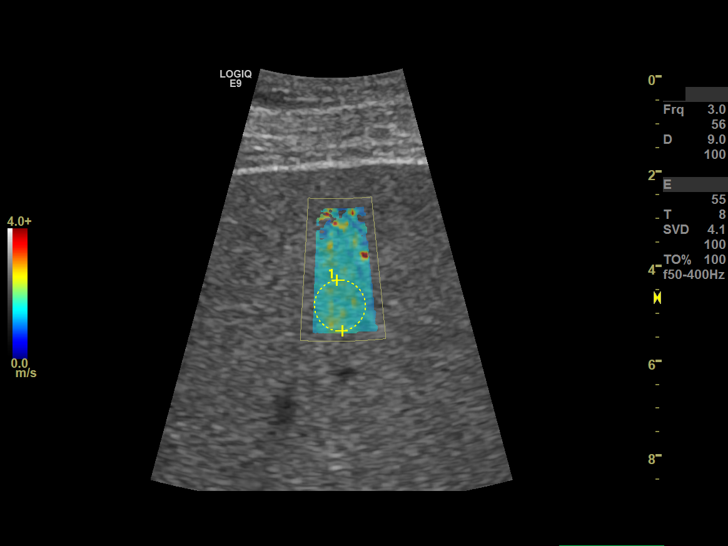
[im 5/10]
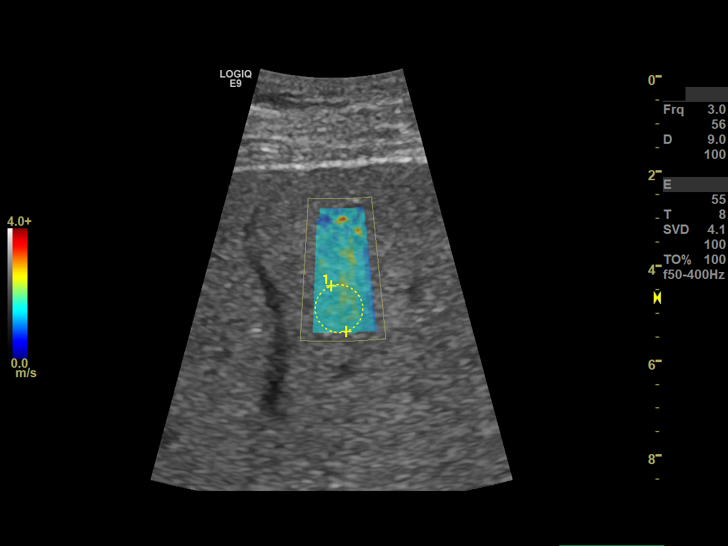
[im 6/10]
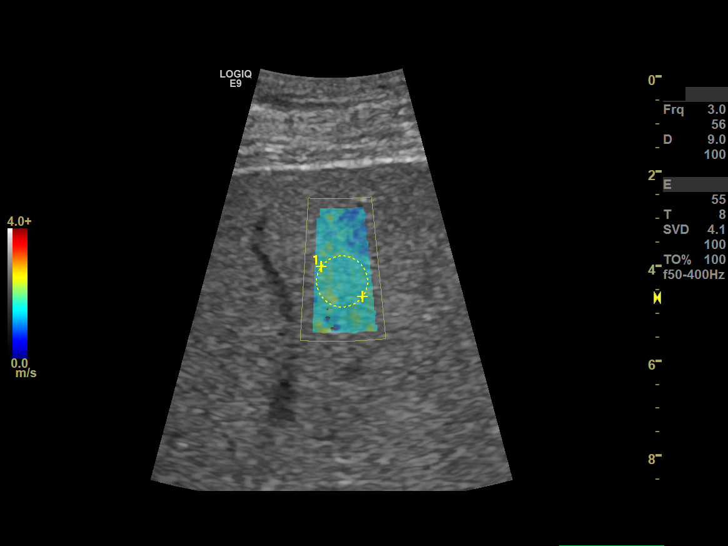
[im 7/10]
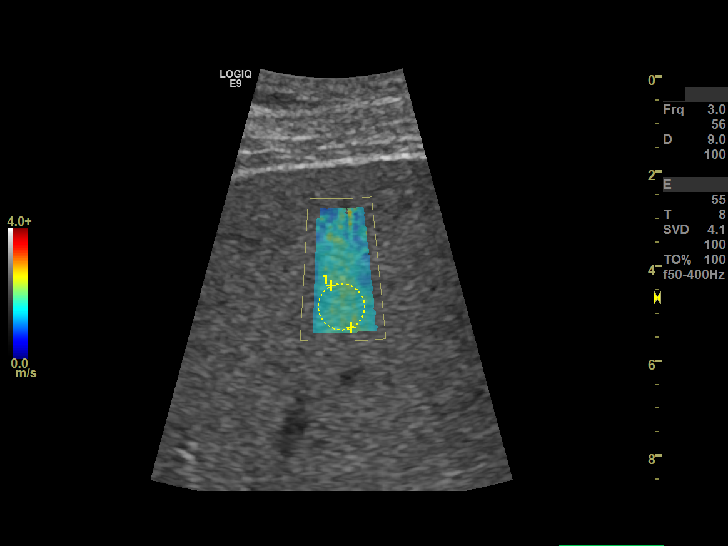
[im 8/10]
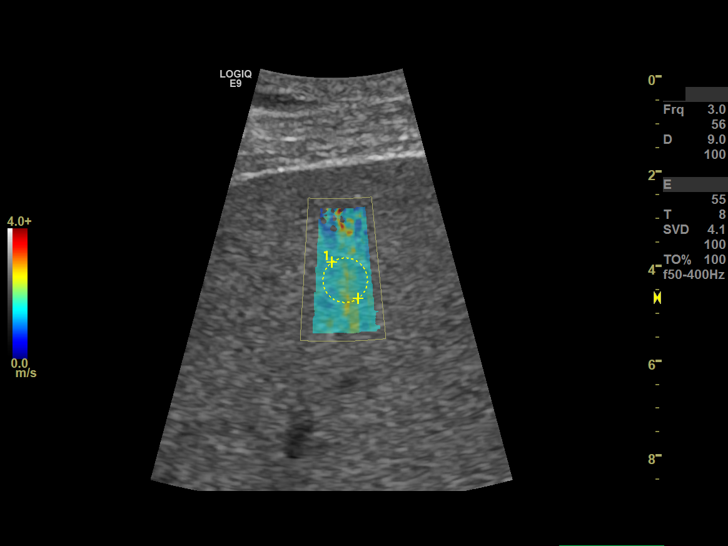
[im 9/10]
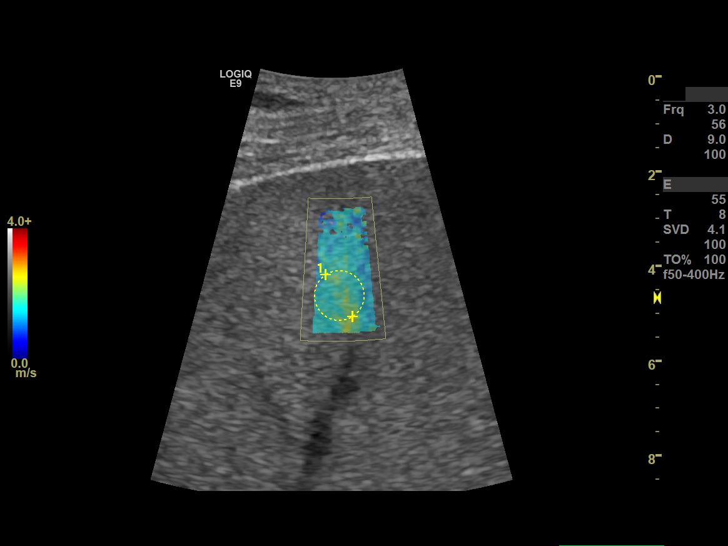
[im 10/10]
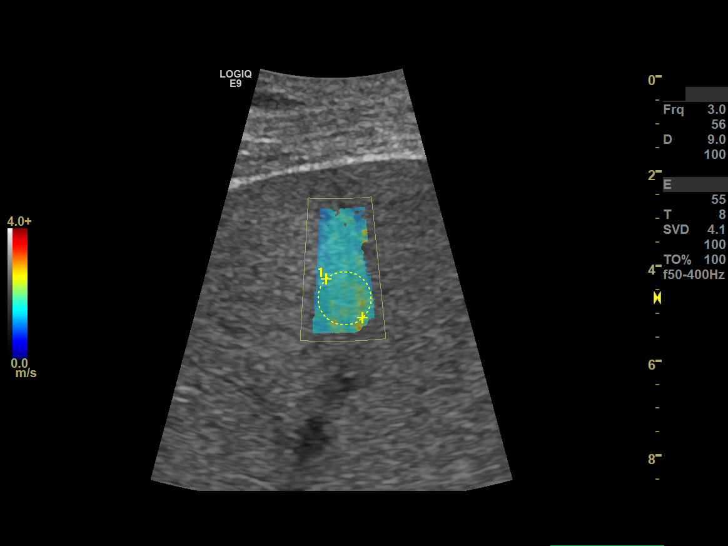

[10 of 10 positions shown; findings below may reference images not displayed]

US ELASTOGRAPHY E9

10  shear wave measurements were acquired using a right intercostal approach, with median of 1.63 m/s (range, 1.60 - 1.69 m/s). The IQR/median value is 1.63. Measurements were obtained using a C1-6 transducer.
IMPRESSION: 1. Normal-mild liver fibrosis (METAVIR score F1).

2. Median liver shear wave speed = 1.63 m/s.

GE E9 Shear Wave Elastography Reference Ranges

Liver Fibrosis Staging:        METAVIR Score                    m/s

Normal-Mild                        F1                              1.35-1.66 m/s

Mild-Moderate                    F2                              1.66-1.77 m/s

Moderate-Severe                 F3                              1.77-1.99 m/s

Cirrhosis                             F4                                     >1.99 m/s

Note, ultrasound shear wave measurements may also be affected by presence of hepatic congestion, steatosis and inflammation. Shear wave speed measurements should be interpreted in conjunction with other available clinical data, and they should not be considered interchangeable with MRI- derived stiffness measurements at this time.

## 2022-03-29 IMAGING — MR MRCP (MR Cholangiogram w/Reconstructions)
10 of 11 series · 46 of 48 positions shown · non-contrast
Comparison: none

[Series 3: t2_haste_cor · coronal · 6.0mm · 0.73mm/px · 3 of 30 slices shown]
[im 1/30]
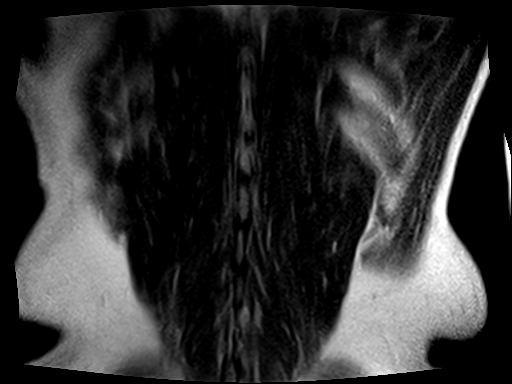
[im 15/30]
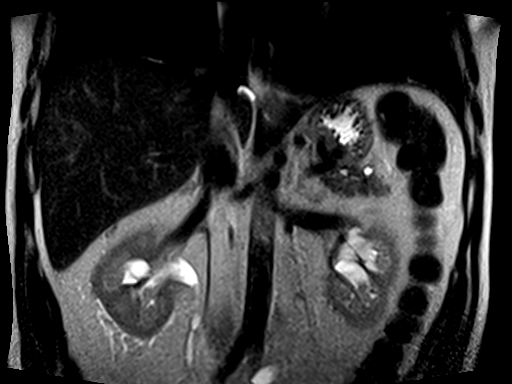
[im 30/30]
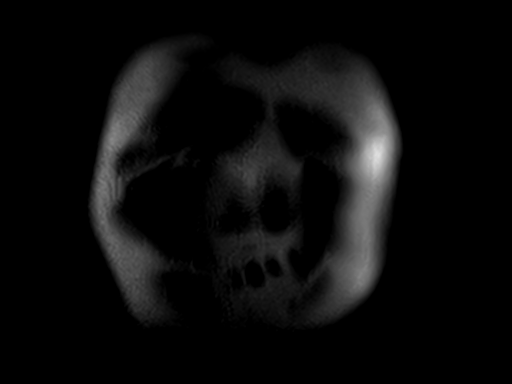

[Series 4: t2_axial_fs_bh · axial · 6.0mm · 0.78mm/px · z∈[-148,+69]mm · 3 of 30 slices shown]
[im 1/30]
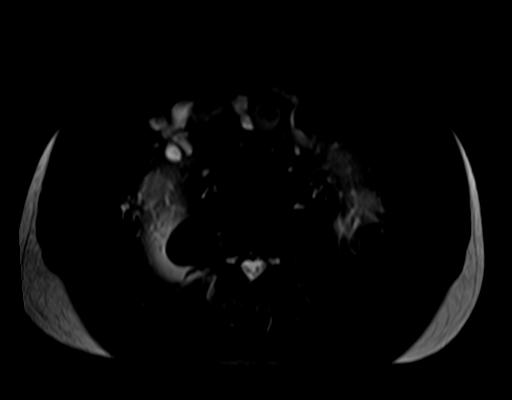
[im 15/30]
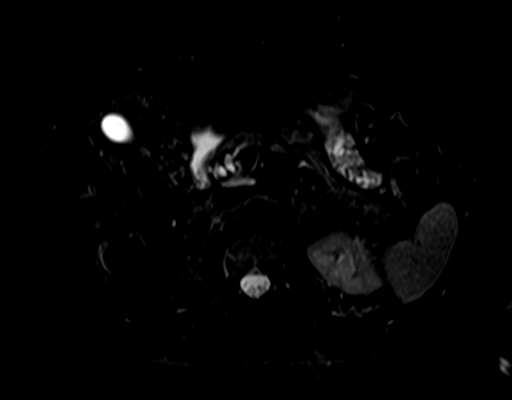
[im 30/30]
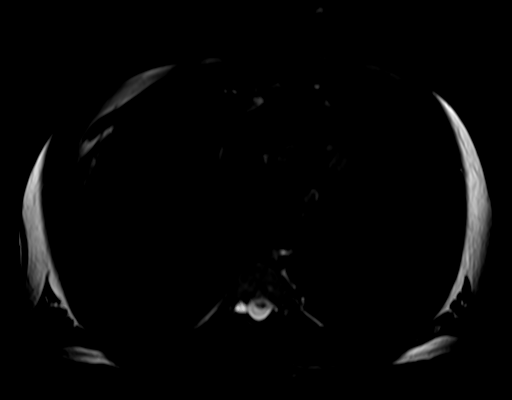

[Series 6: t2_spc_(person_name)_(person_name) · coronal · 1.1mm · 1.48mm/px · 4 of 52 slices shown]
[im 1/52]
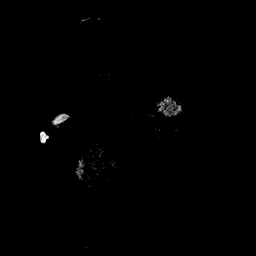
[im 18/52]
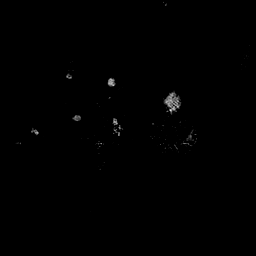
[im 35/52]
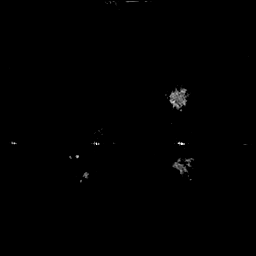
[im 52/52]
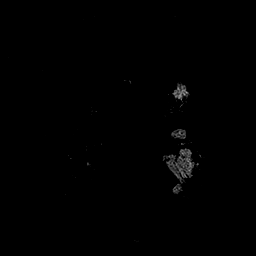

[Series 8: MRCP · sagittal · 40.0mm · 0.91mm/px · 1 of 6 slices shown]
[im 1/6]
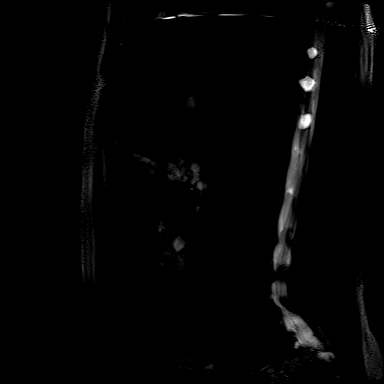

[Series 14: t1_vibe_(person_name)_(person_name)_in · axial · 3.0mm · 1.25mm/px · z∈[-138,+75]mm · 6 of 72 slices shown]
[im 1/72]
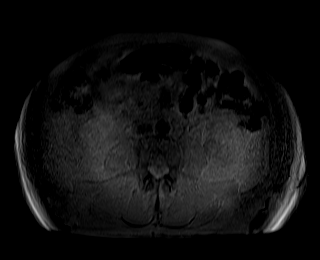
[im 15/72]
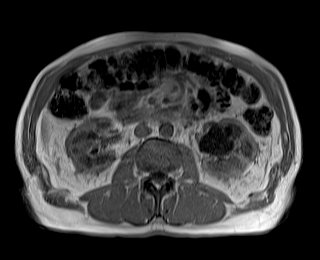
[im 29/72]
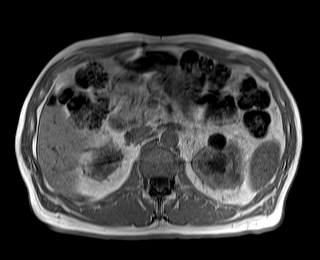
[im 43/72]
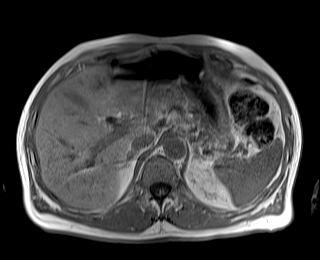
[im 57/72]
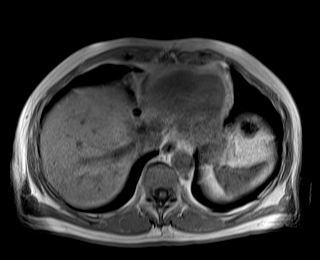
[im 72/72]
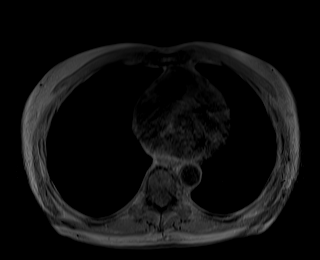

[Series 15: t1_vibe_(person_name)_(person_name)_(person_name) · axial · 3.0mm · 1.25mm/px · z∈[-138,+75]mm · 6 of 72 slices shown (1 of 2)]
[im 1/72]
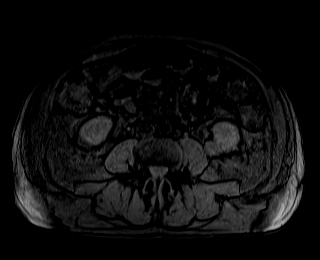
[im 15/72]
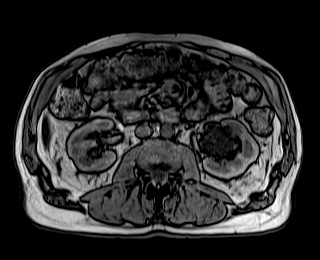
[im 29/72]
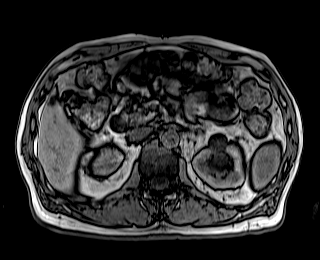
[im 43/72]
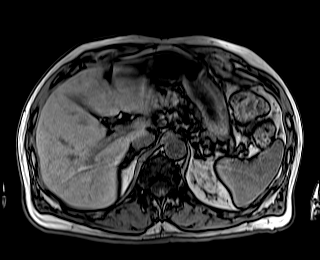
[im 57/72]
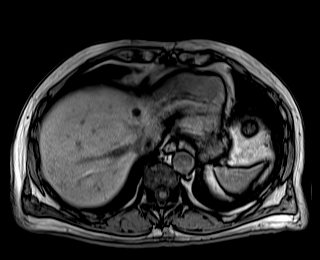
[im 72/72]
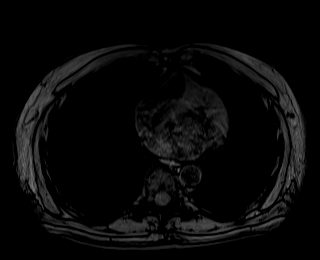

[Series 17: t1_vibe_(person_name)_(person_name)_(person_name) · axial · 3.0mm · 1.25mm/px · z∈[-138,+75]mm · 6 of 72 slices shown (2 of 2)]
[im 1/72]
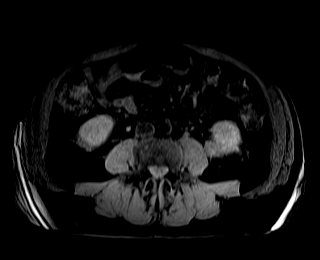
[im 15/72]
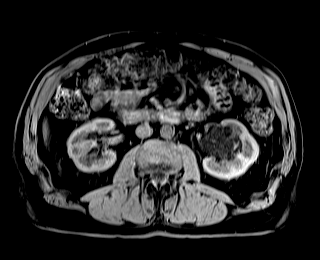
[im 29/72]
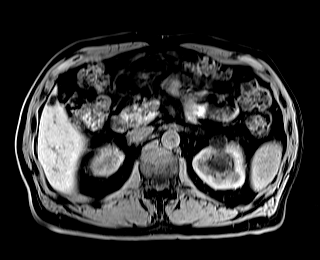
[im 43/72]
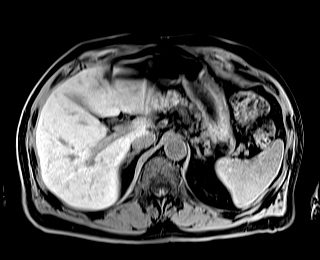
[im 57/72]
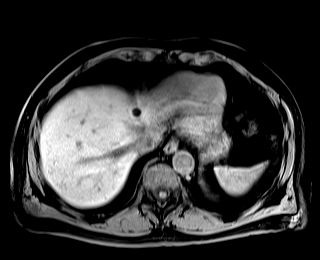
[im 72/72]
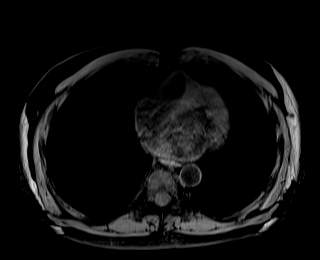

[Series 21: t1_vibe_(person_name)_(person_name)+(person_name)1_w · axial · 3.0mm · 1.25mm/px · z∈[-138,+75]mm · 6 of 72 slices shown]
[im 1/72]
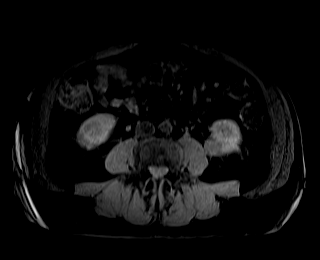
[im 15/72]
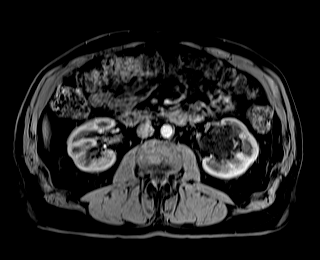
[im 29/72]
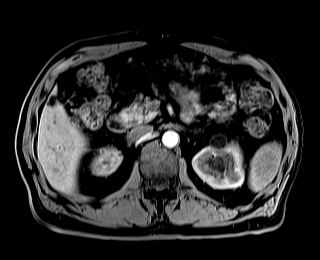
[im 43/72]
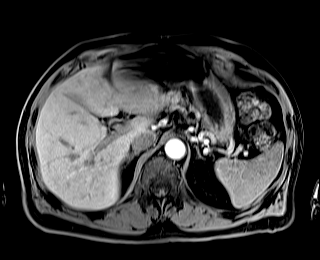
[im 57/72]
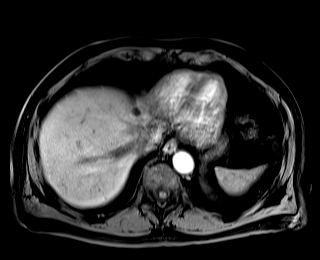
[im 72/72]
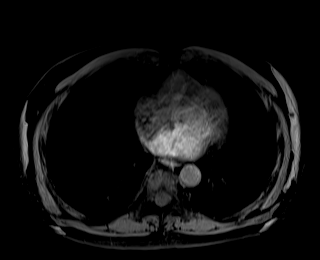

[Series 25: t1_vibe_(person_name)_(person_name)+(person_name)2_w · axial · 3.0mm · 1.25mm/px · z∈[-138,+75]mm · 6 of 72 slices shown]
[im 1/72]
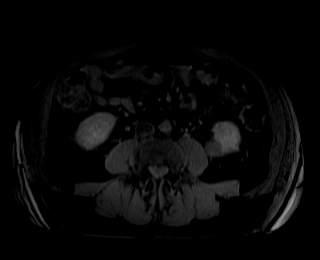
[im 15/72]
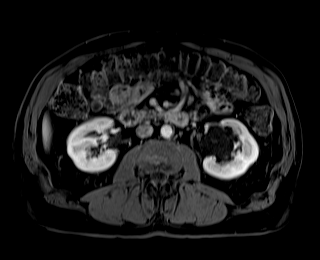
[im 29/72]
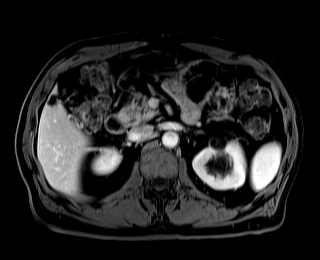
[im 43/72]
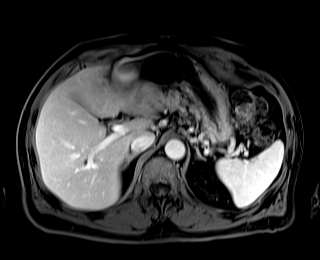
[im 57/72]
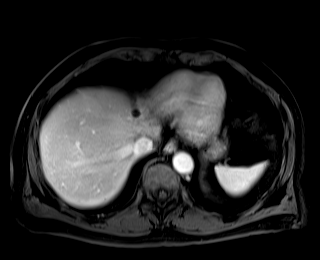
[im 72/72]
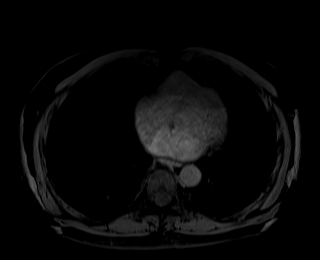

[Series 30: t1_vibe_(person_name)_(person_name)+(person_name)3_w · axial · 3.0mm · 1.25mm/px · z∈[-138,+30]mm · 5 of 72 slices shown]
[im 1/72]
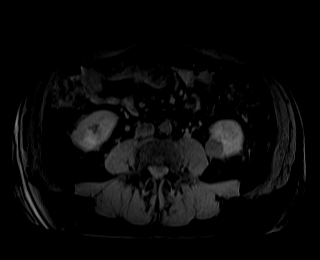
[im 15/72]
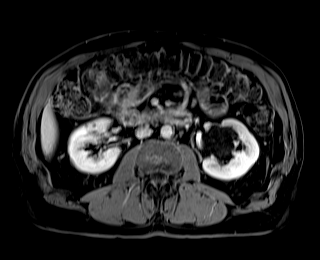
[im 29/72]
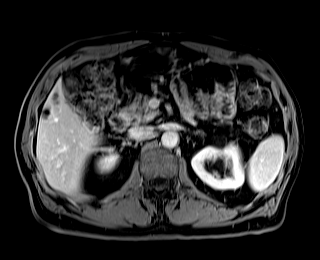
[im 43/72]
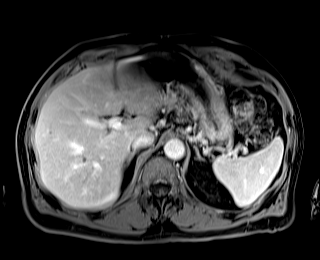
[im 57/72]
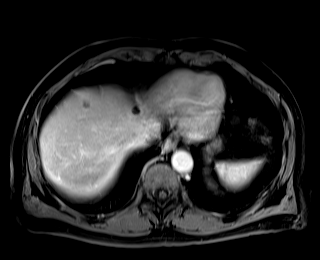

[46 of 48 positions shown; findings below may reference images not displayed]

Images Obtained from [facility]
REASON FOR EXAM
*  Cyst of pancreas, follow-up
COMPARISON
*  MRCP 10/14/2020
TECHNIQUE
*  MR images of the abdomen were acquired before and after the administration of 20 cc Prohance IV contrast
*  Heavily T2-weighted MRCP images were acquired
*  3-D rotating MIP images were generated with post-processing software
FINDINGS
Biliary Ducts
*  Maximum diameter of the common bile/common hepatic ducts is 7 mm
*  No mural irregularity
*  No rounded filling defects in the extra-hepatic bile ducts
*  No intrahepatic biliary dilatation
*  Poorly distended gallbladder
Pancreatic Ducts
*  Normal ductal anatomy
*  Maximum diameter of the main pancreatic duct is 1.5 mm
*  No ductal stricture
Pancreatic Parenchyma
*  Small cystic lesions are similar to the comparison exam, one example listed below
*  Pancreatic head cystic lesion ([DATE])  11 mm   --->   11 mm
*  No significant solid lesion
Liver
*  Smooth margins
*  Normal size
*  No steatosis
*  Multiple small cysts
*  No suspicious lesion
Portal
*  Patent portal vein, splenic vein, and SMV
*  Patent hepatic veins
*  No ascites
*  No significant collateral veins
Kidneys and Adrenals
*  Multiple renal cysts, many of which are parapelvic in the left kidney
Lymph Nodes
*  No suspicious lymph nodes
Additional
*  Moderate colonic stool burden
*  No malignant osseous enhancement
IMPRESSION
*  Small pancreatic cystic lesions are stable since 10/14/2020. Consider follow-up MRCP in 2 years
*  Negative exam for choledocholithiasis or biliary stricture
